# Patient Record
Sex: Male | Born: 1965 | Race: White | Smoking: Former smoker
Health system: Southern US, Community
[De-identification: ages and names within clinical notes are randomized; demographics above are authoritative.]

---

## 2017-09-12 ENCOUNTER — Other Ambulatory Visit: Payer: Self-pay | Admitting: Gerontology

## 2017-09-12 ENCOUNTER — Ambulatory Visit (INDEPENDENT_AMBULATORY_CARE_PROVIDER_SITE_OTHER): Payer: Self-pay

## 2017-09-12 DIAGNOSIS — R0602 Shortness of breath: Secondary | ICD-10-CM

## 2017-09-12 DIAGNOSIS — R079 Chest pain, unspecified: Secondary | ICD-10-CM
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
## 2022-06-21 ENCOUNTER — Ambulatory Visit (INDEPENDENT_AMBULATORY_CARE_PROVIDER_SITE_OTHER): Payer: 59 | Admitting: Plastic Surgery

## 2022-06-21 ENCOUNTER — Encounter: Payer: Self-pay | Admitting: Plastic Surgery

## 2022-06-21 DIAGNOSIS — D485 Neoplasm of uncertain behavior of skin: Secondary | ICD-10-CM | POA: Diagnosis not present

## 2022-06-21 DIAGNOSIS — R222 Localized swelling, mass and lump, trunk: Secondary | ICD-10-CM

## 2022-06-21 DIAGNOSIS — L819 Disorder of pigmentation, unspecified: Secondary | ICD-10-CM | POA: Insufficient documentation

## 2022-06-21 NOTE — Progress Notes (Signed)
     Patient ID: Benjamin Daugherty, male    DOB: 16-Oct-1966, 56 y.o.   MRN: [id number]   Chief Complaint  Patient presents with   Consult   Skin Problem    The patient is a 56 year old male here for evaluation of his skin.  He has a 2 cm pigmented lesion on his back that is somewhat ulcerated and is very concerning for skin cancer.  It has a center ulceration with some scab on it.  He also has a mass on his right upper back shoulder area and is 4 cm in size.  It feels like a sebaceous cyst.  Its been there for several years and is getting larger.  He is otherwise in good health.    Review of Systems  Constitutional: Negative.   HENT: Negative.    Eyes: Negative.   Respiratory: Negative.  Negative for chest tightness and shortness of breath.   Cardiovascular: Negative.  Negative for leg swelling.  Gastrointestinal: Negative.   Endocrine: Negative.   Genitourinary: Negative.   Musculoskeletal:  Negative for back pain.  Skin:  Positive for wound.  Hematological: Negative.     History reviewed. No pertinent past medical history.  History reviewed. No pertinent surgical history.   No current outpatient medications on file.   Objective:   Vitals:   06/21/22 1145  BP: 123/81  Pulse: (!) 58  SpO2: 97%    Physical Exam Constitutional:      Appearance: Normal appearance.  HENT:     Head: Normocephalic and atraumatic.  Cardiovascular:     Rate and Rhythm: Normal rate.     Pulses: Normal pulses.  Pulmonary:     Effort: No respiratory distress.  Abdominal:     General: There is no distension.     Palpations: Abdomen is soft.  Skin:    General: Skin is warm.     Capillary Refill: Capillary refill takes less than 2 seconds.     Findings: Lesion present. No erythema.  Neurological:     Mental Status: He is alert. He is disoriented.  Psychiatric:        Mood and Affect: Mood normal.        Behavior: Behavior normal.        Thought Content: Thought content normal.      Assessment & Plan:  Changing pigmented skin lesion  Mass of subcutaneous tissue of back  Plan for excision of back changing skin lesion concerning for skin cancer. Excision of mass of right upper back/shoulder.  Pictures were obtained of the patient and placed in the chart with the patient's or guardian's permission.   Enoch, DO

## 2022-07-08 ENCOUNTER — Other Ambulatory Visit (HOSPITAL_COMMUNITY)
Admission: RE | Admit: 2022-07-08 | Discharge: 2022-07-08 | Disposition: A | Discharge status: Home / Self Care | Payer: 59 | Source: Ambulatory Visit | Attending: Plastic Surgery | Admitting: Plastic Surgery

## 2022-07-08 ENCOUNTER — Ambulatory Visit (INDEPENDENT_AMBULATORY_CARE_PROVIDER_SITE_OTHER): Payer: 59 | Admitting: Plastic Surgery

## 2022-07-08 ENCOUNTER — Encounter: Payer: Self-pay | Admitting: Plastic Surgery

## 2022-07-08 VITALS — BP 107/72 | HR 63

## 2022-07-08 DIAGNOSIS — R222 Localized swelling, mass and lump, trunk: Secondary | ICD-10-CM

## 2022-07-08 DIAGNOSIS — L819 Disorder of pigmentation, unspecified: Secondary | ICD-10-CM

## 2022-07-08 DIAGNOSIS — D485 Neoplasm of uncertain behavior of skin: Secondary | ICD-10-CM | POA: Diagnosis not present

## 2022-07-08 NOTE — Progress Notes (Signed)
Procedure Note  Preoperative Dx:  Mass of right posterior shoulder Changing skin lesion mid back  Postoperative Dx: Same  Procedure:  Excision of right posterior shoulder sebaceous cyst 5 x 5 cm Excision of changing skin lesion mid back 1.5 x 2 cm  Anesthesia: Lidocaine 1% with 1:100,000 epinephrine  Description of Procedure: Risks and complications were explained to the patient.  Consent was confirmed and the patient understands the risks and benefits.  The potential complications and alternatives were explained and the patient consents.  The patient expressed understanding the option of not having the procedure and the risks of a scar.  Time out was called and all information was confirmed to be correct.    The area was prepped and drapped.  Lidocaine 1% with epinepherine was injected in the subcutaneous area.    Shoulder:  After waiting several minutes for the local to take affect a #15 blade was used to incise the skin over the lesion.  The tissue scissors and #15 blade were used to free the mass from surrounding tissue.  It had the appearance of a sebaceous cyst 5 x 5 cm in size.  The entire sac and lesion were excised.  The skin was closed with a 4-0 Monocryl.  Back: The #15 blade was used to excise the 1.5 x 2 cm lesion. Undermining was done for 2 mm for a better closure. The skin edges were reapproximated with 4-0 Monocryl.  A dressing was applied.  The patient was given instructions on how to care for the area and a follow up appointment.  Jerek tolerated the procedure well and there were no complications. The specimens were sent to pathology.

## 2022-07-12 LAB — SURGICAL PATHOLOGY

## 2022-07-19 ENCOUNTER — Encounter: Payer: Self-pay | Admitting: Student

## 2022-07-19 ENCOUNTER — Ambulatory Visit (INDEPENDENT_AMBULATORY_CARE_PROVIDER_SITE_OTHER): Payer: 59 | Admitting: Student

## 2022-07-19 DIAGNOSIS — R222 Localized swelling, mass and lump, trunk: Secondary | ICD-10-CM

## 2022-07-19 DIAGNOSIS — L819 Disorder of pigmentation, unspecified: Secondary | ICD-10-CM

## 2022-07-19 NOTE — Progress Notes (Signed)
Patient is a 56 year old male with history of mass and lesion to his right shoulder and back.  Patient underwent excision of right posterior shoulder sebaceous cyst and excision of changing skin lesion to the mid back with Dr. Marla Roe on 07/08/2022.  Skin edges to the right shoulder were closed with 4-0 Monocryl.  The skin edges to the back were closed with 4-0 Monocryl as well.  Patient presents to the clinic today for postoperative follow-up.  Surgical pathology shows that the cyst to the right shoulder was a benign epidermal cyst, negative for malignancy.  The skin lesion to the right mid back excision showed a basal cell carcinoma, all surgical margins were negative for carcinoma.  Today, patient reports he is doing well.  He denies any issues with the surgical site.  He denies any fevers or chills, nausea or vomiting.  I discussed the results of the pathology with the patient.  I discussed with him that he is going to have to follow-up with dermatology given that the skin lesion to the right mid back was a basal cell carcinoma.  Patient expressed understanding.  On exam, patient is sitting upright in no acute distress.  There appears to be an eschar over the right shoulder surgical site.  There does appear to be a little bit of surrounding irritation.  There is very minimal serosanguineous drainage.  There is no purulent drainage.  There is no malodor.  The sutures were cut and removed from the surgical site.  Patient tolerated well.  The right mid back incision appears to be intact with some surrounding irritation.  There is no swelling, drainage or malodor.  The sutures were removed.  Patient tolerated well.  I discussed with the patient that he should put Vaseline daily over the surgical sites.  I instructed the patient to continue to monitor the surgical sites, and if he has any concerns regarding the surgical sites he should let us know.  Patient expressed understanding.  I will send a  referral to dermatology so patient can follow-up with them regarding the basal cell carcinoma.  I instructed patient to call us with any questions or concerns.  Pictures were obtained of the patient and placed in the chart with the patient's or guardian's permission.  Patient to follow-up next week.

## 2022-07-27 ENCOUNTER — Telehealth: Payer: Self-pay

## 2022-07-27 ENCOUNTER — Ambulatory Visit (INDEPENDENT_AMBULATORY_CARE_PROVIDER_SITE_OTHER): Payer: 59 | Admitting: Student

## 2022-07-27 DIAGNOSIS — D485 Neoplasm of uncertain behavior of skin: Secondary | ICD-10-CM | POA: Diagnosis not present

## 2022-07-27 DIAGNOSIS — L819 Disorder of pigmentation, unspecified: Secondary | ICD-10-CM

## 2022-07-27 NOTE — Progress Notes (Signed)
   Referring Provider No referring provider defined for this encounter.   CC: No chief complaint on file.     Benjamin Daugherty is an 56 y.o. male.  HPI: Patient is a 56 year old male with history of mass and lesion to his right shoulder and back.  Patient underwent excision of right posterior shoulder sebaceous cyst and excision of changing skin lesion to the mid back with Dr. Marla Roe on 07/08/2022.  Surgical pathology showed the cyst to the right shoulder was benign epidermal cyst.  The skin lesion to the right mid back showed basal cell carcinoma. Patient presents to the clinic today for follow up.   Patient was last seen in the clinic on 07/19/2022.  At this visit, he was doing well.  He denies any issues.  On exam, there is a little bit of eschar over the right shoulder surgical site.  There is a little bit of surrounding irritation.  There were no purulent drainage.  The incision to the mid back appears to be intact with some surrounding irritation.  Plan was for patient to apply Vaseline to the areas and to follow-up in a week.  Today, patient reports he is doing well.  He states he feels the area to his right shoulder has completely healed.  He states that he has been applying Vaseline to the right shoulder procedure site. He denies any fevers or chills.  He denies any issues with the surgical site.  Patient states that he has not been able to get in with dermatology yet.  Review of Systems General: Denies fevers or chills  Physical Exam    07/08/2022    1:30 PM 06/21/2022   11:45 AM  Vitals with BMI  Height  '5\' 8"'$   Weight  165 lbs 6 oz  BMI  00.93  Systolic 818 299  Diastolic 72 81  Pulse 63 58    General:  No acute distress,  Alert and oriented, Non-Toxic, Normal speech and affect On exam, patient is sitting upright in no acute distress.  There appears to be a dry scab over the surgical site to the right shoulder.  It looks slightly irritated, but improved from previous exam.   There is no drainage, swelling or malodor noted.  The incision to the right mid back is intact.  There is some scabbing to the medial aspect of it.  There is some slight irritation surrounding it, but it is improved from the previous exam.  There is no swelling, drainage or malodor noted.  Assessment/Plan  Changing pigmented skin lesion   I discussed with the patient that he should continue Vaseline daily to the surgical sites.  I discussed with the patient that we will double check on the referral to dermatology and that he should look out for a call from dermatology.  Patient expressed understanding.  I instructed the patient to call if he has any questions or concerns.  Patient to follow-up in 2 weeks.  Clance Boll 07/27/2022, 1:03 PM

## 2022-07-27 NOTE — Telephone Encounter (Signed)
Faxed referral to [facility], Dr. Hubbard Hartshorn office with demographics, insurance card and ov note. Received success confirmation.

## 2022-08-10 ENCOUNTER — Ambulatory Visit (INDEPENDENT_AMBULATORY_CARE_PROVIDER_SITE_OTHER): Payer: 59 | Admitting: Student

## 2022-08-10 DIAGNOSIS — L819 Disorder of pigmentation, unspecified: Secondary | ICD-10-CM | POA: Diagnosis not present

## 2022-08-10 NOTE — Progress Notes (Signed)
   Referring Provider No referring provider defined for this encounter.   CC: No chief complaint on file.     Benjamin Daugherty is an 56 y.o. male.  HPI: Patient is a 56 year old male with history of mass and lesion to his right shoulder and back.  Patient underwent excision of right posterior shoulder sebaceous cyst and excision of changing skin lesion to the mid back with Dr. Marla Roe on 07/08/2022.  Surgical pathology showed the cyst to the right shoulder was benign epidermal cyst.  The skin lesion to the right mid back showed basal cell carcinoma. Patient presents to the clinic today for follow up.  Patient was last seen in the clinic on 07/27/2022.  At this visit, patient reported he was doing well.  On exam, there is some scabbing over the surgical site to the right shoulder.  There was a little bit of irritation, but improved from previous exam.  The incision to the right mid back was intact and there is some scabbing to the medial aspect and a little bit of irritation, but is improved from the prior exam.  Plan is for patient to continue Vaseline daily to the surgical sites and follow-up in 2 weeks.  Today, patient reports he is doing well.  He denies any issues with the surgical sites.  He reports that they have been improving.  He states he has been placing Vaseline to the surgical site on his right shoulder.  Patient also reports dermatology did call him today and we will plan on following up with them  Review of Systems General: Denies any recent changes to the surgical sites, denies any fevers or chills  Physical Exam    07/08/2022    1:30 PM 06/21/2022   11:45 AM  Vitals with BMI  Height  '5\' 8"'$   Weight  165 lbs 6 oz  BMI  40.98  Systolic 119 147  Diastolic 72 81  Pulse 63 58    General:  No acute distress,  Alert and oriented, Non-Toxic, Normal speech and affect Chaperone present on exam.  On exam, she is sitting upright in no acute distress.  The surgical site to his right  shoulder appears almost completely healed.  There is a little bit of scabbing centrally.  There is no surrounding erythema, drainage or swelling.  He surgical site to his right mid back is healing well.  There is still little bit of scabbing to the superior aspect of it.  There is no surrounding erythema, drainage or swelling.  Assessment/Plan  Changing pigmented skin lesion   I discussed with the patient that he should continue to apply Vaseline to the surgical sites for the next week or so.  I discussed with the patient that he should continue to monitor the areas, and if he feels like they do not improve or if he has any concerns to let us know.  Patient expressed understanding.  I discussed with the patient that he needs to follow-up with dermatology since the lesion to his right mid back ended up being basal cell carcinoma.  Patient expressed understanding.  I instructed the patient that he can call us if he has any questions or concerns.  Patient to follow-up as needed.  Pictures were obtained of the patient and placed in the chart with the patient's or guardian's permission.   Clance Boll 08/10/2022, 8:21 AM
